# Patient Record
Sex: Male | Born: 1965
Health system: Southern US, Community
[De-identification: ages and names within clinical notes are randomized; demographics above are authoritative.]

## PROBLEM LIST (undated history)

## (undated) DIAGNOSIS — B1920 Unspecified viral hepatitis C without hepatic coma: Secondary | ICD-10-CM

---

## 1999-08-13 ENCOUNTER — Emergency Department (HOSPITAL_COMMUNITY): Admission: EM | Admit: 1999-08-13 | Discharge: 1999-08-13 | Payer: Self-pay | Admitting: Emergency Medicine

## 2002-01-09 ENCOUNTER — Ambulatory Visit (HOSPITAL_COMMUNITY): Admission: RE | Admit: 2002-01-09 | Discharge: 2002-01-09 | Payer: Self-pay | Admitting: Internal Medicine

## 2002-01-09 ENCOUNTER — Encounter: Payer: Self-pay | Admitting: Internal Medicine

## 2002-01-10 ENCOUNTER — Ambulatory Visit (HOSPITAL_COMMUNITY): Admission: RE | Admit: 2002-01-10 | Discharge: 2002-01-10 | Payer: Self-pay | Admitting: Internal Medicine

## 2002-01-10 ENCOUNTER — Encounter: Payer: Self-pay | Admitting: Internal Medicine

## 2002-08-13 ENCOUNTER — Encounter (INDEPENDENT_AMBULATORY_CARE_PROVIDER_SITE_OTHER): Payer: Self-pay | Admitting: Specialist

## 2002-08-13 ENCOUNTER — Encounter: Payer: Self-pay | Admitting: Internal Medicine

## 2002-08-13 ENCOUNTER — Ambulatory Visit (HOSPITAL_COMMUNITY): Admission: RE | Admit: 2002-08-13 | Discharge: 2002-08-13 | Payer: Self-pay | Admitting: Internal Medicine

## 2002-09-06 ENCOUNTER — Emergency Department (HOSPITAL_COMMUNITY): Admission: EM | Admit: 2002-09-06 | Discharge: 2002-09-06 | Payer: Self-pay | Admitting: Emergency Medicine

## 2002-11-29 ENCOUNTER — Encounter (INDEPENDENT_AMBULATORY_CARE_PROVIDER_SITE_OTHER): Payer: Self-pay | Admitting: Specialist

## 2002-11-29 ENCOUNTER — Ambulatory Visit (HOSPITAL_COMMUNITY): Admission: RE | Admit: 2002-11-29 | Discharge: 2002-11-29 | Payer: Self-pay | Admitting: Gastroenterology

## 2003-05-20 ENCOUNTER — Encounter: Payer: Self-pay | Admitting: Internal Medicine

## 2003-05-20 ENCOUNTER — Ambulatory Visit (HOSPITAL_COMMUNITY): Admission: RE | Admit: 2003-05-20 | Discharge: 2003-05-20 | Payer: Self-pay | Admitting: Internal Medicine

## 2004-03-27 ENCOUNTER — Ambulatory Visit (HOSPITAL_COMMUNITY): Admission: RE | Admit: 2004-03-27 | Discharge: 2004-03-27 | Payer: Self-pay | Admitting: Internal Medicine

## 2004-03-28 ENCOUNTER — Ambulatory Visit (HOSPITAL_COMMUNITY): Admission: RE | Admit: 2004-03-28 | Discharge: 2004-03-28 | Payer: Self-pay | Admitting: Internal Medicine

## 2004-04-19 ENCOUNTER — Ambulatory Visit: Payer: Self-pay | Admitting: Nurse Practitioner

## 2004-06-15 ENCOUNTER — Ambulatory Visit: Payer: Self-pay | Admitting: Nurse Practitioner

## 2004-09-13 ENCOUNTER — Ambulatory Visit: Payer: Self-pay | Admitting: Nurse Practitioner

## 2004-10-05 ENCOUNTER — Ambulatory Visit: Payer: Self-pay | Admitting: Nurse Practitioner

## 2004-12-10 ENCOUNTER — Ambulatory Visit: Payer: Self-pay | Admitting: Nurse Practitioner

## 2005-01-10 ENCOUNTER — Ambulatory Visit: Payer: Self-pay | Admitting: Nurse Practitioner

## 2005-07-26 ENCOUNTER — Ambulatory Visit: Payer: Self-pay | Admitting: Nurse Practitioner

## 2006-02-26 ENCOUNTER — Ambulatory Visit: Payer: Self-pay | Admitting: Nurse Practitioner

## 2006-07-25 ENCOUNTER — Ambulatory Visit: Payer: Self-pay | Admitting: Nurse Practitioner

## 2006-08-25 ENCOUNTER — Ambulatory Visit: Payer: Self-pay | Admitting: Nurse Practitioner

## 2007-01-07 ENCOUNTER — Emergency Department (HOSPITAL_COMMUNITY): Admission: EM | Admit: 2007-01-07 | Discharge: 2007-01-07 | Payer: Self-pay | Admitting: Emergency Medicine

## 2007-04-29 ENCOUNTER — Ambulatory Visit: Payer: Self-pay | Admitting: Internal Medicine

## 2007-04-29 ENCOUNTER — Encounter (INDEPENDENT_AMBULATORY_CARE_PROVIDER_SITE_OTHER): Payer: Self-pay | Admitting: Nurse Practitioner

## 2007-04-29 LAB — CONVERTED CEMR LAB
ALT: 11 units/L (ref 0–53)
Albumin: 4.5 g/dL (ref 3.5–5.2)
Basophils Absolute: 0 10*3/uL (ref 0.0–0.1)
Basophils Relative: 0 % (ref 0–1)
CO2: 22 meq/L (ref 19–32)
Calcium: 9.7 mg/dL (ref 8.4–10.5)
Cholesterol: 193 mg/dL (ref 0–200)
Eosinophils Relative: 1 % (ref 0–5)
HCT: 46.9 % (ref 39.0–52.0)
HCV Quantitative: <5 intl units/mL (ref <5)
HDL: 44 mg/dL (ref >39)
Hemoglobin: 15.5 g/dL (ref 13.0–17.0)
Lymphocytes Relative: 28 % (ref 12–46)
MCHC: 33 g/dL (ref 30.0–36.0)
MCV: 95.3 fL (ref 78.0–100.0)
Monocytes Absolute: 0.7 10*3/uL (ref 0.2–0.7)
Platelets: 241 10*3/uL (ref 150–400)
Potassium: 4.7 meq/L (ref 3.5–5.3)
RBC: 4.92 M/uL (ref 4.22–5.81)
Sodium: 140 meq/L (ref 135–145)
TSH: 1.669 microintl units/mL (ref 0.350–5.50)
Total CHOL/HDL Ratio: 4.4
WBC: 9.7 10*3/uL (ref 4.0–10.5)

## 2007-05-29 ENCOUNTER — Ambulatory Visit: Payer: Self-pay | Admitting: Internal Medicine

## 2007-08-27 ENCOUNTER — Ambulatory Visit: Payer: Self-pay | Admitting: Family Medicine

## 2007-08-27 ENCOUNTER — Encounter (INDEPENDENT_AMBULATORY_CARE_PROVIDER_SITE_OTHER): Payer: Self-pay | Admitting: Nurse Practitioner

## 2007-08-27 LAB — CONVERTED CEMR LAB
Alkaline Phosphatase: 96 units/L (ref 39–117)
Bilirubin, Direct: 0.1 mg/dL (ref 0.0–0.3)
Cholesterol: 217 mg/dL — ABNORMAL HIGH (ref 0–200)
Triglycerides: 472 mg/dL — ABNORMAL HIGH (ref <150)

## 2008-03-03 ENCOUNTER — Ambulatory Visit: Payer: Self-pay | Admitting: Internal Medicine

## 2008-03-17 ENCOUNTER — Ambulatory Visit: Payer: Self-pay | Admitting: Internal Medicine

## 2008-07-05 ENCOUNTER — Ambulatory Visit: Payer: Self-pay | Admitting: Internal Medicine

## 2008-08-08 ENCOUNTER — Ambulatory Visit: Payer: Self-pay | Admitting: Internal Medicine

## 2008-08-09 ENCOUNTER — Encounter (INDEPENDENT_AMBULATORY_CARE_PROVIDER_SITE_OTHER): Payer: Self-pay | Admitting: Internal Medicine

## 2008-08-09 ENCOUNTER — Ambulatory Visit: Payer: Self-pay | Admitting: Internal Medicine

## 2008-08-09 LAB — CONVERTED CEMR LAB
ALT: 16 units/L (ref 0–53)
AST: 16 units/L (ref 0–37)
Albumin: 4.3 g/dL (ref 3.5–5.2)
Calcium: 9.2 mg/dL (ref 8.4–10.5)
Chloride: 105 meq/L (ref 96–112)
Cholesterol: 201 mg/dL — ABNORMAL HIGH (ref 0–200)
Glucose, Bld: 102 mg/dL — ABNORMAL HIGH (ref 70–99)
LDL Cholesterol: 115 mg/dL — ABNORMAL HIGH (ref 0–99)
Potassium: 4.7 meq/L (ref 3.5–5.3)
Sodium: 141 meq/L (ref 135–145)
Total Bilirubin: 0.4 mg/dL (ref 0.3–1.2)
Total Protein: 7.3 g/dL (ref 6.0–8.3)
Triglycerides: 228 mg/dL — ABNORMAL HIGH (ref <150)
VLDL: 46 mg/dL — ABNORMAL HIGH (ref 0–40)

## 2008-08-18 ENCOUNTER — Emergency Department (HOSPITAL_COMMUNITY): Admission: EM | Admit: 2008-08-18 | Discharge: 2008-08-18 | Payer: Self-pay | Admitting: Gastroenterology

## 2008-11-16 ENCOUNTER — Ambulatory Visit: Payer: Self-pay | Admitting: Internal Medicine

## 2008-11-16 ENCOUNTER — Encounter (INDEPENDENT_AMBULATORY_CARE_PROVIDER_SITE_OTHER): Payer: Self-pay | Admitting: Internal Medicine

## 2008-11-16 LAB — CONVERTED CEMR LAB
AST: 14 units/L (ref 0–37)
Albumin: 4.3 g/dL (ref 3.5–5.2)
BUN: 12 mg/dL (ref 6–23)
Chloride: 102 meq/L (ref 96–112)
Glucose, Bld: 95 mg/dL (ref 70–99)
HDL: 39 mg/dL — ABNORMAL LOW (ref >39)
Total Bilirubin: 0.5 mg/dL (ref 0.3–1.2)
Total Protein: 7.2 g/dL (ref 6.0–8.3)

## 2008-12-12 ENCOUNTER — Ambulatory Visit: Payer: Self-pay | Admitting: Internal Medicine

## 2008-12-12 ENCOUNTER — Encounter (INDEPENDENT_AMBULATORY_CARE_PROVIDER_SITE_OTHER): Payer: Self-pay | Admitting: Internal Medicine

## 2008-12-12 LAB — CONVERTED CEMR LAB
Amphetamine Screen, Ur: NEGATIVE
Barbiturate Quant, Ur: NEGATIVE
Methadone: NEGATIVE
Opiate Screen, Urine: NEGATIVE

## 2008-12-20 ENCOUNTER — Ambulatory Visit: Payer: Self-pay | Admitting: Internal Medicine

## 2008-12-20 ENCOUNTER — Emergency Department (HOSPITAL_COMMUNITY): Admission: EM | Admit: 2008-12-20 | Discharge: 2008-12-20 | Payer: Self-pay | Admitting: Emergency Medicine

## 2009-06-27 ENCOUNTER — Emergency Department (HOSPITAL_COMMUNITY): Admission: EM | Admit: 2009-06-27 | Discharge: 2009-06-27 | Payer: Self-pay | Admitting: Emergency Medicine

## 2010-05-10 ENCOUNTER — Emergency Department (HOSPITAL_COMMUNITY): Admission: EM | Admit: 2010-05-10 | Discharge: 2010-05-10 | Payer: Self-pay | Admitting: Emergency Medicine

## 2010-10-18 LAB — CBC
HCT: 41.4 % (ref 39.0–52.0)
RBC: 4.53 MIL/uL (ref 4.22–5.81)
RDW: 12.7 % (ref 11.5–15.5)
WBC: 11.9 10*3/uL — ABNORMAL HIGH (ref 4.0–10.5)

## 2010-10-18 LAB — POCT I-STAT, CHEM 8
BUN: 17 mg/dL (ref 6–23)
Calcium, Ion: 1.16 mmol/L (ref 1.12–1.32)
Chloride: 105 mEq/L (ref 96–112)
Glucose, Bld: 89 mg/dL (ref 70–99)
Hemoglobin: 15 g/dL (ref 13.0–17.0)
Potassium: 3.7 mEq/L (ref 3.5–5.1)
TCO2: 26 mmol/L (ref 0–100)

## 2010-10-18 LAB — DIFFERENTIAL
Eosinophils Absolute: 0.4 10*3/uL (ref 0.0–0.7)
Eosinophils Relative: 3 % (ref 0–5)
Lymphs Abs: 4.3 10*3/uL — ABNORMAL HIGH (ref 0.7–4.0)
Neutro Abs: 6.3 10*3/uL (ref 1.7–7.7)
Neutrophils Relative %: 53 % (ref 43–77)

## 2010-12-21 NOTE — Op Note (Signed)
NAME:  Christopher Singleton, Christopher Singleton NO.:  [id number]   MEDICAL RECORD NO.:  [id number]                   PATIENT TYPE:  AMB   LOCATION:                                       FACILITY:  [facility]   PHYSICIAN:  Anselmo Rod, M.D.               DATE OF BIRTH:  21-Aug-1965   DATE OF PROCEDURE:  11/29/2002  DATE OF DISCHARGE:                                 OPERATIVE REPORT   PROCEDURE PERFORMED:  Colonoscopy with snare polypectomy times one.   ENDOSCOPIST:  Anselmo Rod, M.D.   INSTRUMENT USED:  Olympus video colonoscope.   INDICATIONS FOR PROCEDURE:  Rectal bleeding and a family history of colonic  polyps in this 45 year old white male.  Rule out colonic polyps, masses and  hemorrhoids.   PRE-PROCEDURE PREPARATION:  Informed consent was procured from the patient.  The patient was fasted for eight hours prior to the procedure and prepped  with a bottle of magnesium citrate and a gallon of GoLYTELY the night prior  to the procedure.   PRE-PROCEDURE PHYSICAL:  VITAL SIGNS:  Stable vital signs.  HEENT:  Neck is supple.  CHEST:  Clear to auscultation.  CARDIOVASCULAR:  S1, S2 and regular.  ABDOMEN:  Soft with normal bowel sounds.   DESCRIPTION OF PROCEDURE:  The patient was placed in the left lateral  decubitus position and sedated with 100 mg of Demerol and 10 mg of Versed  intravenously.  Once the patient was adequately sedated and maintained on  low flow oxygen and continuous cardiac monitoring the Olympus video  colonoscope was advanced from the rectum to the cecum with difficulty.  There was a large amount of residual stool in the colon and multiple  washings were done. A large pedunculated polyp was snared from 30 cm.  Prominent internal hemorrhoids were seen on retroflexion with a large amount  of debris in the right colon.  Multiple washings were done.  The appendiceal  orifice and ileocecal valve were clearly visualized and photographed.  Except for  the polyp mentioned at 30 cm, no other abnormalities were noted.   IMPRESSION:  1. Large pedunculated polyp snared at 30 cm.  2. Prominent internal hemorrhoids seen on retroflexion.  3. Large amount of residual stool in the colon.  Small lesions could have     been missed.    RECOMMENDATIONS:  1. Await pathology results.  2. Avoid all non-steroidals including aspirin for the next four weeks.  3. Outpatient follow-up in the next two weeks for further recommendations.                                               Anselmo Rod, M.D.    JNM/MEDQ  D:  11/29/2002  T:  11/29/2002  Job:  [id number]  

## 2014-04-13 ENCOUNTER — Emergency Department (HOSPITAL_COMMUNITY)
Admission: EM | Admit: 2014-04-13 | Discharge: 2014-04-13 | Disposition: A | Discharge status: Home / Self Care | Payer: Medicare Other | Attending: Emergency Medicine | Admitting: Emergency Medicine

## 2014-04-13 ENCOUNTER — Encounter (HOSPITAL_COMMUNITY): Payer: Self-pay | Admitting: Emergency Medicine

## 2014-04-13 ENCOUNTER — Emergency Department (HOSPITAL_COMMUNITY): Payer: Medicare Other

## 2014-04-13 DIAGNOSIS — R630 Anorexia: Secondary | ICD-10-CM | POA: Insufficient documentation

## 2014-04-13 DIAGNOSIS — F17200 Nicotine dependence, unspecified, uncomplicated: Secondary | ICD-10-CM | POA: Insufficient documentation

## 2014-04-13 DIAGNOSIS — R141 Gas pain: Secondary | ICD-10-CM | POA: Insufficient documentation

## 2014-04-13 DIAGNOSIS — R143 Flatulence: Secondary | ICD-10-CM | POA: Diagnosis not present

## 2014-04-13 DIAGNOSIS — Z8619 Personal history of other infectious and parasitic diseases: Secondary | ICD-10-CM | POA: Insufficient documentation

## 2014-04-13 DIAGNOSIS — R1031 Right lower quadrant pain: Secondary | ICD-10-CM | POA: Insufficient documentation

## 2014-04-13 DIAGNOSIS — N201 Calculus of ureter: Secondary | ICD-10-CM

## 2014-04-13 DIAGNOSIS — R112 Nausea with vomiting, unspecified: Secondary | ICD-10-CM | POA: Insufficient documentation

## 2014-04-13 DIAGNOSIS — N1330 Unspecified hydronephrosis: Secondary | ICD-10-CM | POA: Diagnosis not present

## 2014-04-13 DIAGNOSIS — N132 Hydronephrosis with renal and ureteral calculous obstruction: Secondary | ICD-10-CM

## 2014-04-13 DIAGNOSIS — R142 Eructation: Secondary | ICD-10-CM

## 2014-04-13 HISTORY — DX: Unspecified viral hepatitis C without hepatic coma: B19.20

## 2014-04-13 LAB — COMPREHENSIVE METABOLIC PANEL
ALBUMIN: 4.4 g/dL (ref 3.5–5.2)
ALK PHOS: 124 U/L — AB (ref 39–117)
ALT: 19 U/L (ref 0–53)
AST: 18 U/L (ref 0–37)
Anion gap: 15 (ref 5–15)
BUN: 14 mg/dL (ref 6–23)
CO2: 23 mEq/L (ref 19–32)
CREATININE: 1.41 mg/dL — AB (ref 0.50–1.35)
Calcium: 10.1 mg/dL (ref 8.4–10.5)
Chloride: 101 mEq/L (ref 96–112)
GFR calc non Af Amer: 57 mL/min — ABNORMAL LOW (ref >90)
GFR, EST AFRICAN AMERICAN: 66 mL/min — AB (ref >90)
Glucose, Bld: 149 mg/dL — ABNORMAL HIGH (ref 70–99)
POTASSIUM: 4.1 meq/L (ref 3.7–5.3)
SODIUM: 139 meq/L (ref 137–147)
Total Bilirubin: 0.7 mg/dL (ref 0.3–1.2)
Total Protein: 7.9 g/dL (ref 6.0–8.3)

## 2014-04-13 LAB — CBC WITH DIFFERENTIAL/PLATELET
BASOS ABS: 0 10*3/uL (ref 0.0–0.1)
BASOS PCT: 0 % (ref 0–1)
EOS ABS: 0 10*3/uL (ref 0.0–0.7)
Eosinophils Relative: 0 % (ref 0–5)
HCT: 44.9 % (ref 39.0–52.0)
HEMOGLOBIN: 16.2 g/dL (ref 13.0–17.0)
LYMPHS ABS: 0.9 10*3/uL (ref 0.7–4.0)
LYMPHS PCT: 5 % — AB (ref 12–46)
MCH: 32.5 pg (ref 26.0–34.0)
MCHC: 36.1 g/dL — AB (ref 30.0–36.0)
MCV: 90 fL (ref 78.0–100.0)
MONOS PCT: 4 % (ref 3–12)
Monocytes Absolute: 0.7 10*3/uL (ref 0.1–1.0)
NEUTROS ABS: 17 10*3/uL — AB (ref 1.7–7.7)
NEUTROS PCT: 91 % — AB (ref 43–77)
PLATELETS: 277 10*3/uL (ref 150–400)
RBC: 4.99 MIL/uL (ref 4.22–5.81)
RDW: 13 % (ref 11.5–15.5)
WBC: 18.6 10*3/uL — ABNORMAL HIGH (ref 4.0–10.5)

## 2014-04-13 LAB — URINALYSIS, ROUTINE W REFLEX MICROSCOPIC
Bilirubin Urine: NEGATIVE
GLUCOSE, UA: NEGATIVE mg/dL
Ketones, ur: NEGATIVE mg/dL
Leukocytes, UA: NEGATIVE
Nitrite: NEGATIVE
PROTEIN: NEGATIVE mg/dL
Specific Gravity, Urine: 1.005 (ref 1.005–1.030)
UROBILINOGEN UA: 0.2 mg/dL (ref 0.0–1.0)
pH: 6 (ref 5.0–8.0)

## 2014-04-13 LAB — LIPASE, BLOOD: LIPASE: 41 U/L (ref 11–59)

## 2014-04-13 LAB — URINE MICROSCOPIC-ADD ON

## 2014-04-13 MED ORDER — ONDANSETRON 4 MG PO TBDP: 4.0000 mg | ORAL_TABLET | Freq: Three times a day (TID) | ORAL | Status: AC | PRN

## 2014-04-13 MED ORDER — FENTANYL CITRATE 0.05 MG/ML IJ SOLN
50.0000 ug | Freq: Once | INTRAMUSCULAR | Status: AC
  Administered 2014-04-13: 50 ug via INTRAVENOUS
  Filled 2014-04-13: qty 2

## 2014-04-13 MED ORDER — OXYCODONE-ACETAMINOPHEN 5-325 MG PO TABS: 2.0000 | ORAL_TABLET | Freq: Four times a day (QID) | ORAL | Status: AC | PRN

## 2014-04-13 MED ORDER — ONDANSETRON HCL 4 MG/2ML IJ SOLN
4.0000 mg | Freq: Once | INTRAMUSCULAR | Status: AC
  Administered 2014-04-13: 4 mg via INTRAVENOUS
  Filled 2014-04-13: qty 2

## 2014-04-13 MED ORDER — AMOXICILLIN-POT CLAVULANATE 875-125 MG PO TABS: 1.0000 | ORAL_TABLET | Freq: Two times a day (BID) | ORAL | Status: AC

## 2014-04-13 MED ORDER — IOHEXOL 300 MG/ML  SOLN
100.0000 mL | Freq: Once | INTRAMUSCULAR | Status: AC | PRN
  Administered 2014-04-13: 100 mL via INTRAVENOUS

## 2014-04-13 MED ORDER — SODIUM CHLORIDE 0.9 % IV BOLUS (SEPSIS)
1000.0000 mL | Freq: Once | INTRAVENOUS | Status: AC
  Administered 2014-04-13: 1000 mL via INTRAVENOUS

## 2014-04-13 MED ORDER — OXYCODONE-ACETAMINOPHEN 5-325 MG PO TABS
2.0000 | ORAL_TABLET | Freq: Once | ORAL | Status: AC
  Administered 2014-04-13: 2 via ORAL
  Filled 2014-04-13: qty 2

## 2014-04-13 MED ORDER — DEXTROSE 5 % IV SOLN
1.0000 g | Freq: Once | INTRAVENOUS | Status: AC
  Administered 2014-04-13: 1 g via INTRAVENOUS
  Filled 2014-04-13: qty 10

## 2014-04-13 MED ORDER — MORPHINE SULFATE 4 MG/ML IJ SOLN
4.0000 mg | Freq: Once | INTRAMUSCULAR | Status: AC
  Administered 2014-04-13: 4 mg via INTRAVENOUS
  Filled 2014-04-13: qty 1

## 2014-04-13 MED ORDER — FENTANYL CITRATE 0.05 MG/ML IJ SOLN
75.0000 ug | Freq: Once | INTRAMUSCULAR | Status: AC
  Administered 2014-04-13: 75 ug via INTRAVENOUS
  Filled 2014-04-13: qty 2

## 2014-04-13 NOTE — ED Notes (Signed)
Patient returned from Xray. Patient helped to the bathroom.

## 2014-04-13 NOTE — ED Notes (Signed)
PA made aware of the patient's pain.  

## 2014-04-13 NOTE — ED Notes (Signed)
Resident made aware of the patient's pain.

## 2014-04-13 NOTE — ED Notes (Signed)
Pt requesting pain medication Resident made aware.

## 2014-04-13 NOTE — ED Notes (Signed)
MD Pfeiffer at the bedside 

## 2014-04-13 NOTE — ED Notes (Signed)
Pt c/o severe right sided abd pain starting this am; pt refuses to sit in chair and remains on floor; pt sts feels urge to have BM and urinate

## 2014-04-13 NOTE — ED Notes (Signed)
Waiting to give patient medication due to closeness of medication just given.

## 2014-04-13 NOTE — ED Notes (Addendum)
Pt rolling around on the floor after he was checked in with registration; it was stated by Marylene Land, Registration that pt got out of the wheelchair and laid in the floor; myself and Cyndi, RN had pt to get back into the wheelchair; pt stating he's "about to throw up"; pt given an emesis bag to use; then pt asking for water for "my mouth is dry and I need water to drink"; informed pt that it would not be wise to drink any water if he feels like he is about to throw up, that it would probably make him feel worse; pt stated "I don't care. I need water" and proceeds to drink water from the sink; then pt refuses to have his temperature taken stating "I need something for this pain now"; informed pt that a nurse will be with him shortly and try to sit back and relax in the wheelchair

## 2014-04-13 NOTE — ED Notes (Signed)
Pt continues to lay in floor and attempt to drink out of sink; advised to lay in chair

## 2014-04-13 NOTE — Discharge Instructions (Signed)

## 2014-04-13 NOTE — ED Provider Notes (Signed)
CSN: [id number]     Arrival date & time 04/13/14  1220 History   None    Chief Complaint  Patient presents with  . Abdominal Pain    Patient is a 48 y.o. male presenting with abdominal pain. The history is provided by the patient.  Abdominal Pain Pain location:  RLQ Pain quality: sharp   Pain radiates to:  Back Pain severity:  Severe Onset quality:  Sudden Duration: awoke 8am. Timing:  Constant Progression:  Unchanged Chronicity:  New Context: awakening from sleep   Context: not alcohol use, not previous surgeries, not sick contacts, not suspicious food intake and not trauma   Worsened by:  Palpation Associated symptoms: anorexia, chills, flatus, nausea and vomiting   Associated symptoms: no chest pain, no constipation, no cough, no diarrhea, no dysuria, no fever, no hematemesis, no hematochezia, no hematuria and no shortness of breath   Vomiting:    Quality:  Stomach contents   Number of occurrences:  3-5 Risk factors: multiple surgeries (r inguinal hernia repair)   Risk factors: no NSAID use     Past Medical History  Diagnosis Date  . Hepatitis C    History reviewed. No pertinent past surgical history. History reviewed. No pertinent family history. History  Substance Use Topics  . Smoking status: Current Every Day Smoker  . Smokeless tobacco: Not on file  . Alcohol Use: No    Review of Systems  Constitutional: Positive for chills and appetite change. Negative for fever.  Respiratory: Negative for cough, shortness of breath and wheezing.   Cardiovascular: Negative for chest pain.  Gastrointestinal: Positive for nausea, vomiting, abdominal pain, anorexia and flatus. Negative for diarrhea, constipation, blood in stool, hematochezia, abdominal distention and hematemesis.  Genitourinary: Positive for decreased urine volume. Negative for dysuria, hematuria, flank pain, discharge, penile swelling, scrotal swelling, difficulty urinating, penile pain and testicular pain.   Musculoskeletal: Positive for back pain.  Skin: Negative for rash.  Neurological: Negative for light-headedness.  All other systems reviewed and are negative.     Allergies  Review of patient's allergies indicates no known allergies.  Home Medications   Prior to Admission medications   Medication Sig Start Date End Date Taking? Authorizing Provider  PRESCRIPTION MEDICATION Take 1 tablet by mouth daily. "Toe nail medication"   Yes Historical Provider, MD  PRESCRIPTION MEDICATION Apply 1 application topically daily. "Cream to apply to heels"   Yes Historical Provider, MD   BP 140/90  Pulse 64  Resp 22  SpO2 96% Physical Exam  Nursing note and vitals reviewed. Constitutional: He is oriented to person, place, and time. He appears well-developed and well-nourished.  Holding RLQ in pain  HENT:  Head: Normocephalic and atraumatic.  Nose: Nose normal.  Mouth/Throat: No oropharyngeal exudate.  Eyes: Conjunctivae are normal. Pupils are equal, round, and reactive to light. No scleral icterus.  Neck: Normal range of motion. Neck supple. No tracheal deviation present.  Cardiovascular: Normal rate, regular rhythm and normal heart sounds.   No murmur heard. Pulmonary/Chest: Effort normal and breath sounds normal. No respiratory distress. He has no rales. He exhibits no tenderness.  Abdominal: Soft. Bowel sounds are normal. He exhibits no distension and no mass. There is tenderness (RLQ). There is no rebound and no guarding.  Neg CVAT, psoas, obturator signs  Genitourinary: Penis normal. No penile tenderness.  No testicular TTP or swelling.  No hernia.   Musculoskeletal: Normal range of motion. He exhibits no edema.  Neurological: He is alert and oriented to  person, place, and time.  Skin: Skin is warm and dry. No rash noted. He is not diaphoretic.  Psychiatric: He has a normal mood and affect.    ED Course  Procedures (including critical care time) Labs Review Labs Reviewed  CBC  WITH DIFFERENTIAL - Abnormal; Notable for the following:    WBC 18.6 (*)    MCHC 36.1 (*)    Neutrophils Relative % 91 (*)    Neutro Abs 17.0 (*)    Lymphocytes Relative 5 (*)    All other components within normal limits  COMPREHENSIVE METABOLIC PANEL - Abnormal; Notable for the following:    Glucose, Bld 149 (*)    Creatinine, Ser 1.41 (*)    Alkaline Phosphatase 124 (*)    GFR calc non Af Amer 57 (*)    GFR calc Af Amer 66 (*)    All other components within normal limits  URINALYSIS, ROUTINE W REFLEX MICROSCOPIC - Abnormal; Notable for the following:    Hgb urine dipstick LARGE (*)    All other components within normal limits  LIPASE, BLOOD  URINE MICROSCOPIC-ADD ON    Imaging Review Ct Abdomen Pelvis W Contrast  04/13/2014   CLINICAL DATA:  Right-sided abdominal pain with nausea and vomiting for 1 day. History of hepatitis-C.  EXAM: CT ABDOMEN AND PELVIS WITH CONTRAST  TECHNIQUE: Multidetector CT imaging of the abdomen and pelvis was performed using the standard protocol following bolus administration of intravenous contrast.  CONTRAST:  OMNIPAQUE IOHEXOL 300 MG/ML  SOLN  COMPARISON:  05/10/2010  FINDINGS: Minimal dependent atelectasis is present in the lung bases. There is a small sliding hiatal hernia. There is evidence of distal esophageal wall thickening, with evaluation limited by nondistention.  There is a 3 mm stone at the right ureterovesical junction. There is moderate right hydroureteronephrosis, and the right kidney appears edematous with mild perinephric stranding and moderately diminished parenchymal enhancement. No contrast excretion is seen into the right renal collecting system on delayed images. The liver, gallbladder, spleen, adrenal glands, left kidney, and pancreas have an unremarkable enhanced appearance.  There is no evidence of bowel obstruction. 11 mm portacaval lymph node is unchanged and likely reactive. Minimal abdominal aortic atherosclerotic calcification  is noted. Bilateral iliac artery calcifications also noted. No free fluid is seen. Small sclerotic focus is partially visualized in the inferior right femoral neck, grossly unchanged. Mild thoracolumbar spondylosis is noted.  IMPRESSION: 1. 3 mm obstructing right UVJ stone with moderate right hydroureteronephrosis. Right renal parenchymal edema. 2. Small sliding hiatal hernia and persistent distal esophageal wall thickening, incompletely evaluated.   Electronically Signed   By: Sebastian Ache   On: 04/13/2014 16:54     EKG Interpretation None      MDM   Final diagnoses:  Calculus of ureter  Hydronephrosis with urinary obstruction due to ureteral calculus    Pt presents with acute onset of RLQ pain this AM.  +n/v. VSS No urinary complaints. Testicle exam normal, considered torsion but not consistent. Appy? UA neg.  HCV hx but no RUQ.  Normal BM frequency, doubt SBO.  +leukocytosis with left shift, concern for underlying infectious/inflammatory process. Hx of right inguinal hernai repair, no hernia appreciated, but risk factor for pathology. Considered kidney stone as pt was writhing in pain.   5:01 PM CT with 3mm obstructing stone at right UVJ with perinephric stranding.  Concern for obstructing urolithiasis with pyelonephritis.  Consulted urology who would only intervene if UA shows infx markers. UA mislabeled, holding  up pt disposition.  Pending UA.  9:10 PM UA neg for infx.  Pain better controlled. No nausea.  Will try PO percocet, if tolerates d/c home with augmentin and analgesia.   10:00 PM Pain well controlled. No nausea. Ate whole sandwhich without problems.    Clear for dc home.  Return for worsening of condition such as fevers, intractable pain/nausea, urinary retention.  Urology f/u given.   Sofie Rower, MD 04/13/14 2201

## 2014-04-14 NOTE — ED Provider Notes (Signed)
I saw and evaluated the patient, reviewed the resident's note and I agree with the findings and plan.   EKG Interpretation None     Have personally seen and evaluated and examined the patient. I agree with the plan and treatment as given. The patient was treated for pain he was hydrated he was given antibiotics. At the time of discharge pain was controlled patient was eating and drinking and in good condition. The patient is instructed on need for followup and signs and symptoms to return. Urology was consulted and followup plan in place.  Impression kidney stone.  Arby Barrette, MD 04/14/14 [phone]

## 2015-02-10 IMAGING — CT CT ABD-PELV W/ CM
2 of 5 series · 16 of 46 positions shown, 18 images · IV contrast (omnipaque)
Comparison: 05/10/2010

CLINICAL DATA: Right-sided abdominal pain with nausea and vomiting
for 1 day. History of hepatitis-C.

EXAM:
CT ABDOMEN AND PELVIS WITH CONTRAST
TECHNIQUE: Multidetector CT imaging of the abdomen and pelvis was performed
using the standard protocol following bolus administration of
intravenous contrast.
CONTRAST:  100mL OMNIPAQUE IOHEXOL 300 MG/ML  SOLN

[Series 2: abd/ pelvis 5.0 i30f 1 · axial · 0.68mm/px · z∈[+748,+1172]mm · 13 of 97 slices shown, 15 images]
[im 6/97  soft-tissue]
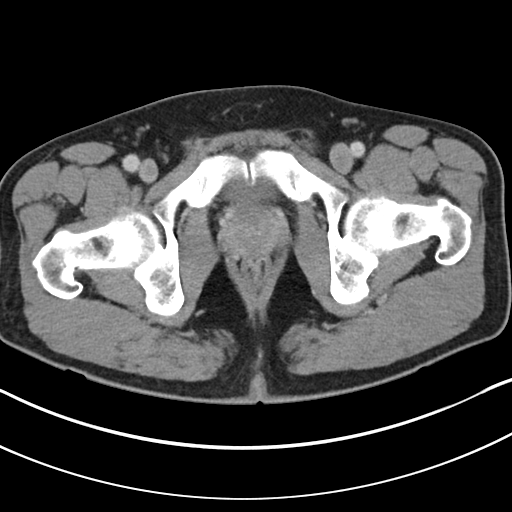
[im 6/97  bone]
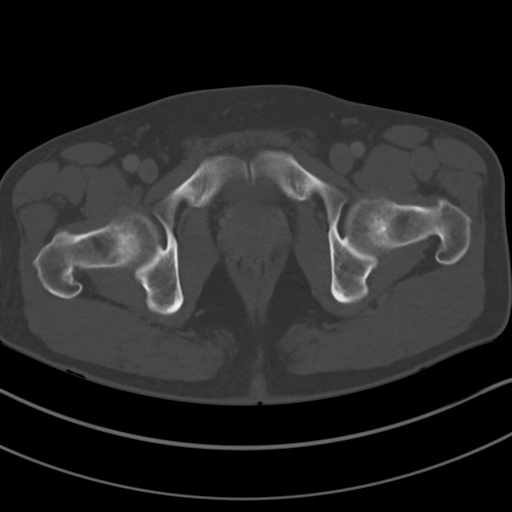
[im 11/97  soft-tissue]
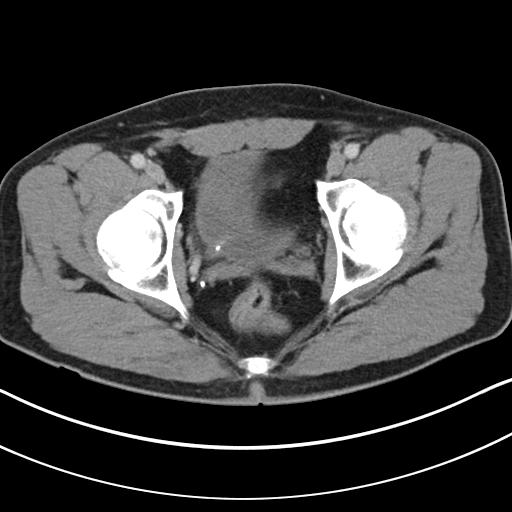
[im 22/97  soft-tissue]
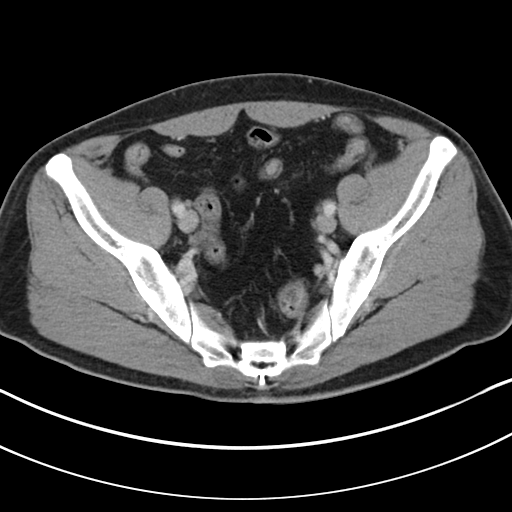
[im 27/97  soft-tissue]
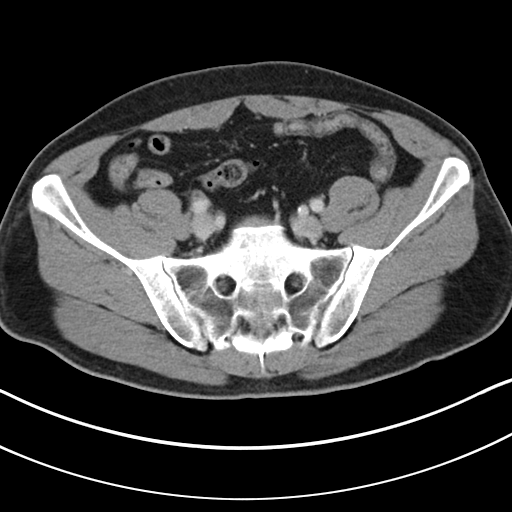
[im 33/97  soft-tissue]
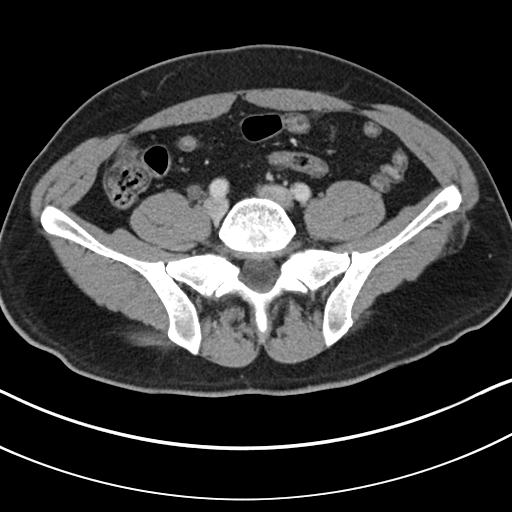
[im 43/97  soft-tissue]
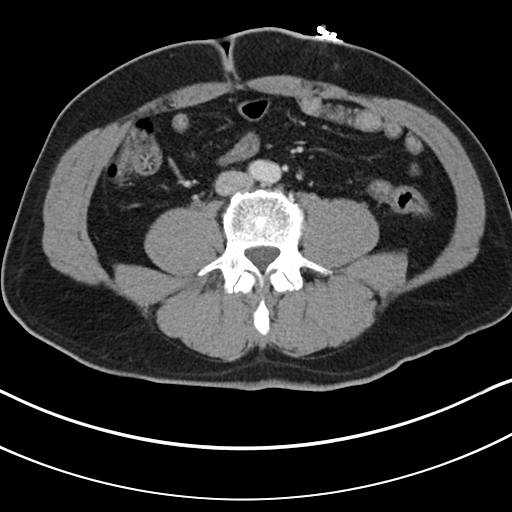
[im 49/97  soft-tissue]
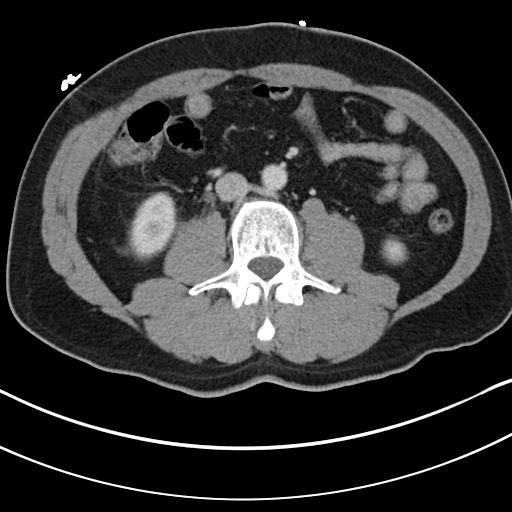
[im 54/97  soft-tissue]
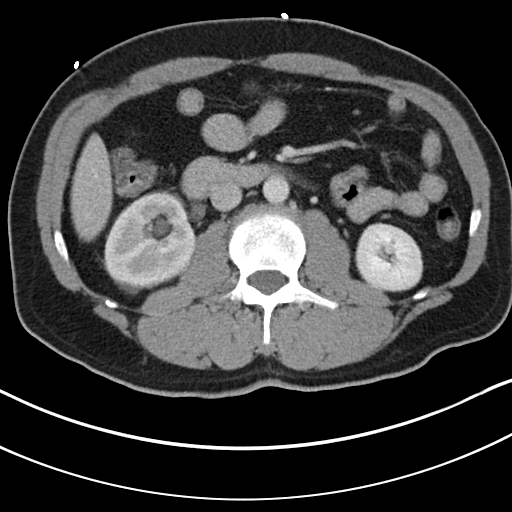
[im 65/97  soft-tissue]
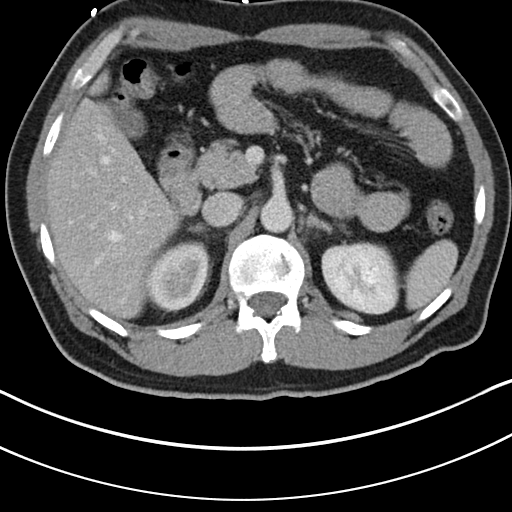
[im 65/97  bone]
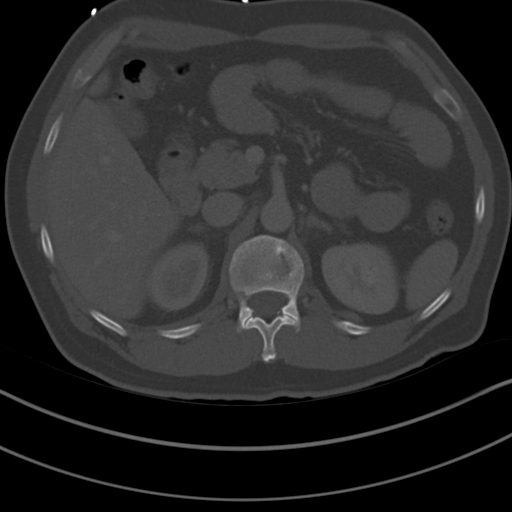
[im 70/97  soft-tissue]
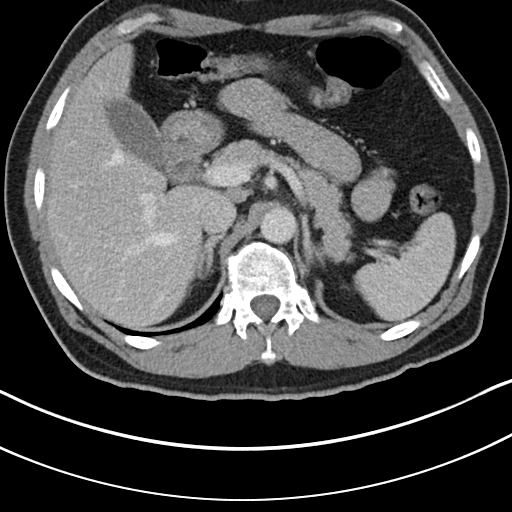
[im 75/97  soft-tissue]
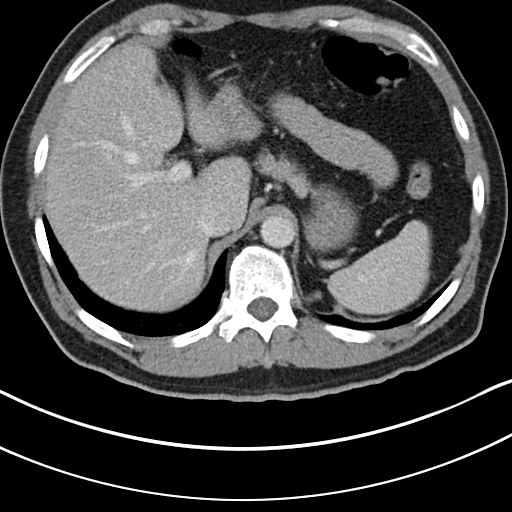
[im 86/97  soft-tissue]
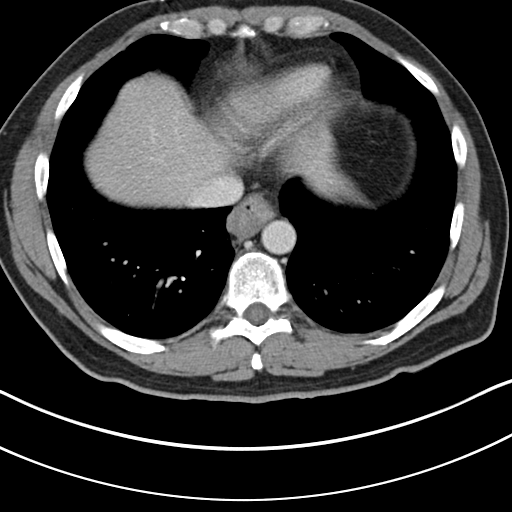
[im 91/97  soft-tissue]
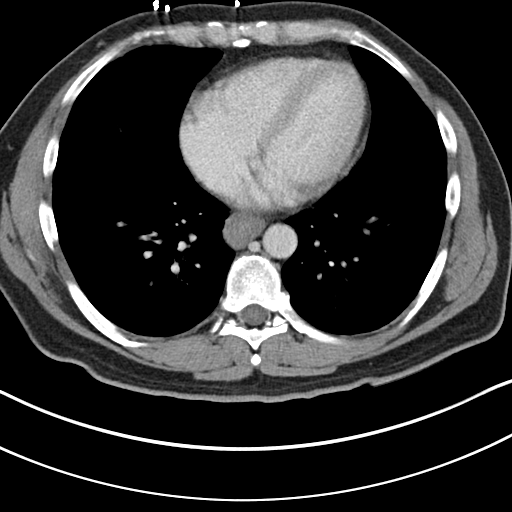

[Series 5: coronal soft tissue · coronal · 0.68mm/px · 3 of 82 slices shown]
[im 28/82  soft-tissue]
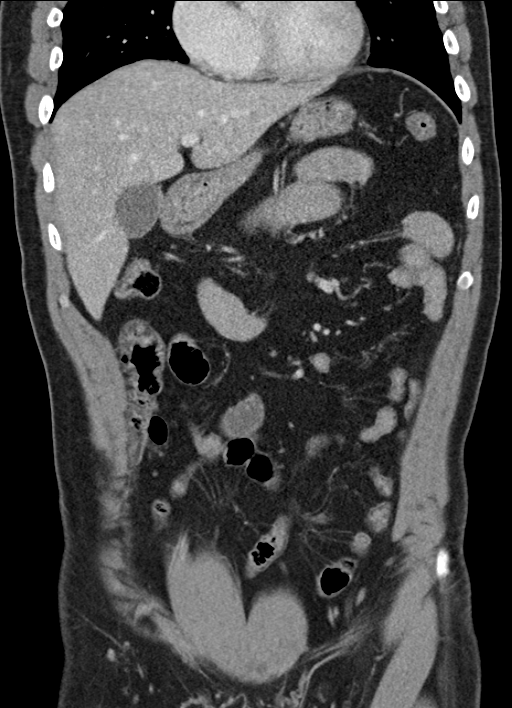
[im 37/82  soft-tissue]
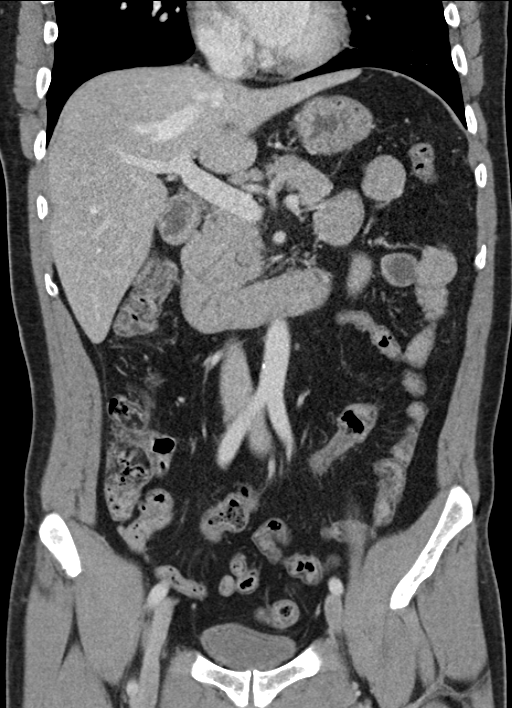
[im 46/82  soft-tissue]
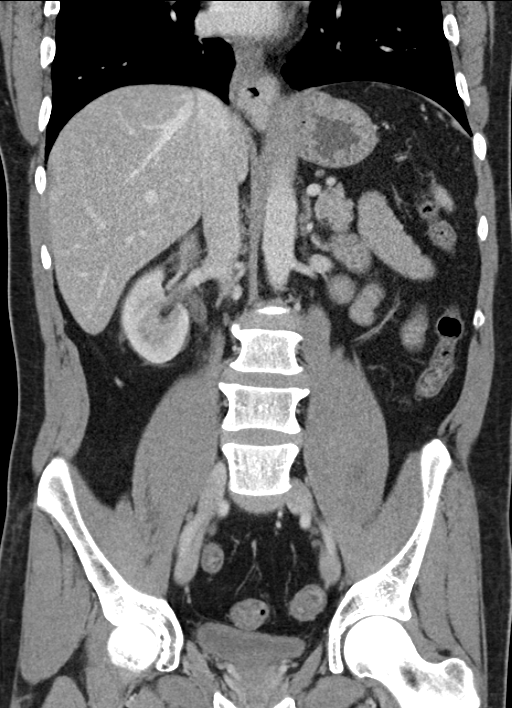

[16 of 46 positions shown; findings below may reference images not displayed]

FINDINGS: Minimal dependent atelectasis is present in the lung bases. There is
a small sliding hiatal hernia. There is evidence of distal
esophageal wall thickening, with evaluation limited by
nondistention.

There is a 3 mm stone at the right ureterovesical junction. There is
moderate right hydroureteronephrosis, and the right kidney appears
edematous with mild perinephric stranding and moderately diminished
parenchymal enhancement. No contrast excretion is seen into the
right renal collecting system on delayed images. The liver,
gallbladder, spleen, adrenal glands, left kidney, and pancreas have
an unremarkable enhanced appearance.

There is no evidence of bowel obstruction. 11 mm portacaval lymph
node is unchanged and likely reactive. Minimal abdominal aortic
atherosclerotic calcification is noted. Bilateral iliac artery
calcifications also noted. No free fluid is seen. Small sclerotic
focus is partially visualized in the inferior right femoral neck,
grossly unchanged. Mild thoracolumbar spondylosis is noted.
IMPRESSION: 1. 3 mm obstructing right UVJ stone with moderate right
hydroureteronephrosis. Right renal parenchymal edema.
2. Small sliding hiatal hernia and persistent distal esophageal wall
thickening, incompletely evaluated.

## 2017-05-23 LAB — BASIC METABOLIC PANEL: GLUCOSE: 182

## 2021-03-03 ENCOUNTER — Other Ambulatory Visit (HOSPITAL_COMMUNITY): Payer: Self-pay
